# Patient Record
Sex: Female | Born: 1960 | Race: White | Hispanic: No | Marital: Single
Health system: Southern US, Community
[De-identification: ages and names within clinical notes are randomized; demographics above are authoritative.]

---

## 2013-06-23 ENCOUNTER — Other Ambulatory Visit: Payer: Self-pay | Admitting: Internal Medicine

## 2013-06-23 DIAGNOSIS — E041 Nontoxic single thyroid nodule: Secondary | ICD-10-CM

## 2013-06-25 ENCOUNTER — Ambulatory Visit
Admission: RE | Admit: 2013-06-25 | Discharge: 2013-06-25 | Disposition: A | Discharge status: Home / Self Care | Payer: BC Managed Care – PPO | Source: Ambulatory Visit | Attending: Internal Medicine | Admitting: Internal Medicine

## 2013-06-25 DIAGNOSIS — E041 Nontoxic single thyroid nodule: Secondary | ICD-10-CM

## 2014-05-10 ENCOUNTER — Other Ambulatory Visit: Payer: Self-pay | Admitting: Obstetrics and Gynecology

## 2014-05-10 DIAGNOSIS — E041 Nontoxic single thyroid nodule: Secondary | ICD-10-CM

## 2014-05-18 ENCOUNTER — Other Ambulatory Visit: Payer: BC Managed Care – PPO

## 2014-07-06 ENCOUNTER — Ambulatory Visit
Admission: RE | Admit: 2014-07-06 | Discharge: 2014-07-06 | Disposition: A | Discharge status: Home / Self Care | Payer: BLUE CROSS/BLUE SHIELD | Source: Ambulatory Visit | Attending: Obstetrics and Gynecology | Admitting: Obstetrics and Gynecology

## 2014-07-06 DIAGNOSIS — E041 Nontoxic single thyroid nodule: Secondary | ICD-10-CM

## 2014-08-22 ENCOUNTER — Other Ambulatory Visit: Payer: Self-pay | Admitting: Endocrinology

## 2014-08-22 DIAGNOSIS — E049 Nontoxic goiter, unspecified: Secondary | ICD-10-CM

## 2014-12-28 ENCOUNTER — Other Ambulatory Visit: Payer: Self-pay | Admitting: Orthopedic Surgery

## 2014-12-28 DIAGNOSIS — S52121A Displaced fracture of head of right radius, initial encounter for closed fracture: Secondary | ICD-10-CM

## 2014-12-29 ENCOUNTER — Ambulatory Visit
Admission: RE | Admit: 2014-12-29 | Discharge: 2014-12-29 | Disposition: A | Discharge status: Home / Self Care | Payer: BLUE CROSS/BLUE SHIELD | Source: Ambulatory Visit | Attending: Orthopedic Surgery | Admitting: Orthopedic Surgery

## 2014-12-29 DIAGNOSIS — S52121A Displaced fracture of head of right radius, initial encounter for closed fracture: Secondary | ICD-10-CM

## 2015-01-30 ENCOUNTER — Other Ambulatory Visit: Payer: Self-pay | Admitting: Internal Medicine

## 2015-01-30 DIAGNOSIS — E049 Nontoxic goiter, unspecified: Secondary | ICD-10-CM

## 2015-02-01 ENCOUNTER — Ambulatory Visit
Admission: RE | Admit: 2015-02-01 | Discharge: 2015-02-01 | Disposition: A | Discharge status: Home / Self Care | Payer: BLUE CROSS/BLUE SHIELD | Source: Ambulatory Visit | Attending: Internal Medicine | Admitting: Internal Medicine

## 2015-02-01 DIAGNOSIS — E049 Nontoxic goiter, unspecified: Secondary | ICD-10-CM

## 2015-02-23 ENCOUNTER — Other Ambulatory Visit: Payer: BLUE CROSS/BLUE SHIELD

## 2016-02-20 ENCOUNTER — Other Ambulatory Visit: Payer: Self-pay | Admitting: Internal Medicine

## 2016-02-20 DIAGNOSIS — E042 Nontoxic multinodular goiter: Secondary | ICD-10-CM

## 2016-02-26 ENCOUNTER — Ambulatory Visit
Admission: RE | Admit: 2016-02-26 | Discharge: 2016-02-26 | Disposition: A | Discharge status: Home / Self Care | Payer: Managed Care, Other (non HMO) | Source: Ambulatory Visit | Attending: Internal Medicine | Admitting: Internal Medicine

## 2016-02-26 DIAGNOSIS — E042 Nontoxic multinodular goiter: Secondary | ICD-10-CM

## 2016-06-30 IMAGING — CT CT ELBOW*R* W/O CM
4 of 5 series · 10 of 20 positions shown, 11 images · non-contrast
Comparison: None.

CLINICAL DATA: Patient tripped and fell 2 days ago. Evaluate radial
head fracture. Initial encounter.

EXAM:
CT OF THE RIGHT ELBOW WITHOUT CONTRAST; 3-DIMENSIONAL CT IMAGE
RENDERING ON INDEPENDENT WORKSTATION
TECHNIQUE: Multidetector CT imaging of the right elbow was performed according
to the standard protocol. Multiplanar CT image and three-dimensional
reconstructions were also generated.

[Series 5: upper ext soft · axial · 0.31mm/px · z∈[-5,+35]mm · 2 of 49 slices shown]
[im 17/49  soft-tissue]
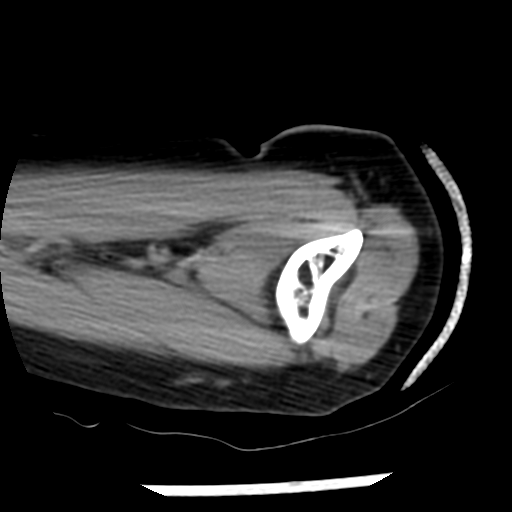
[im 33/49  soft-tissue]
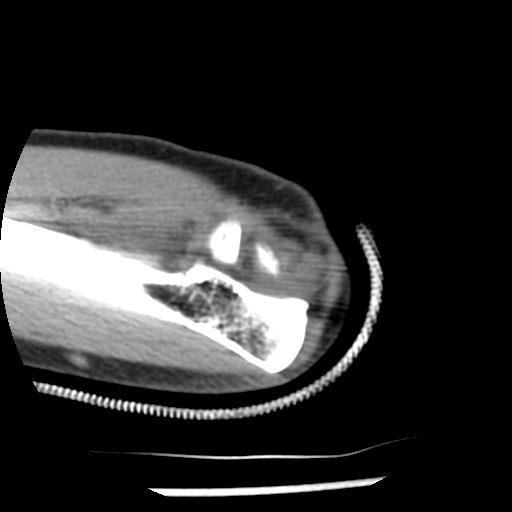

[Series 201: cor bone · axial · 0.31mm/px · z∈[-45,+93]mm · 3 of 48 slices shown, 4 images]
[im 1/48  soft-tissue]
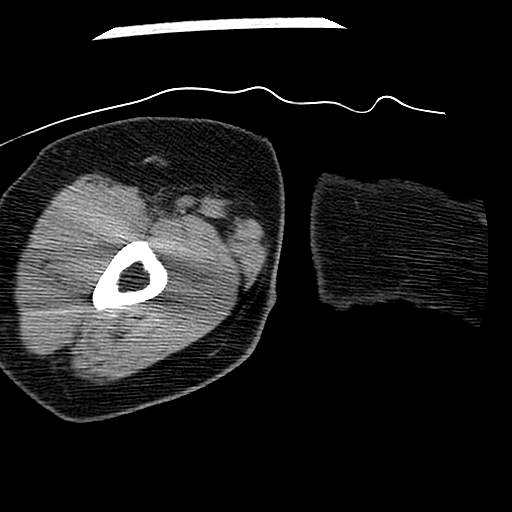
[im 1/48  bone]
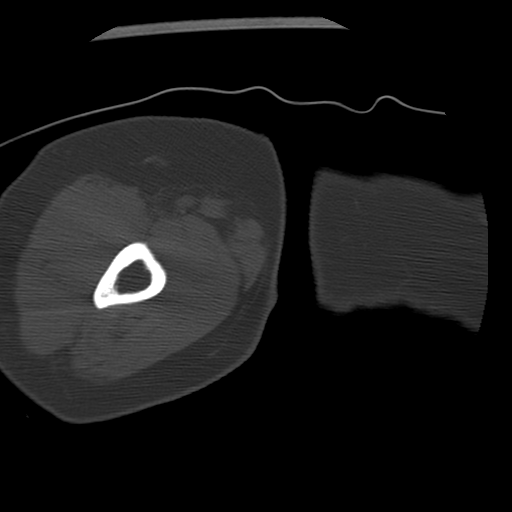
[im 24/48  bone]
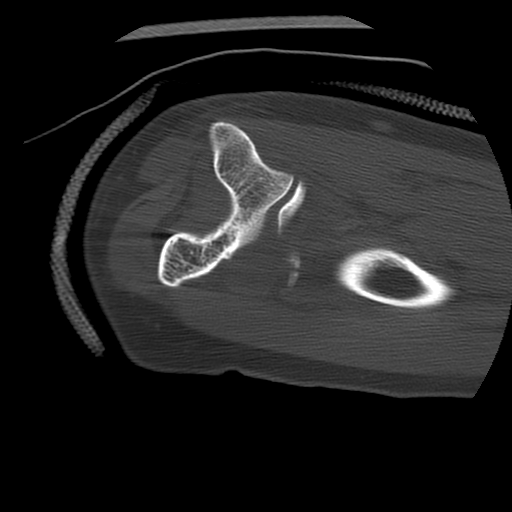
[im 48/48  bone]
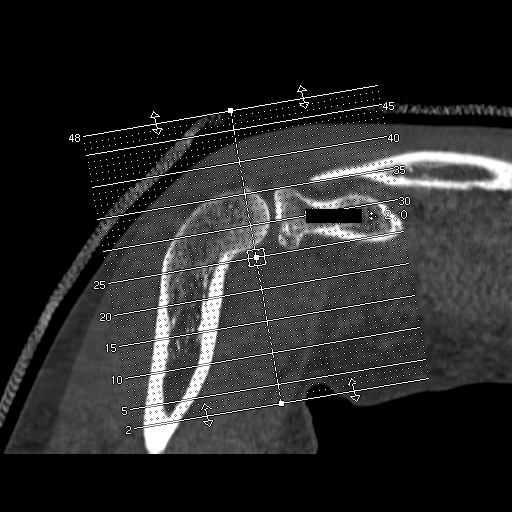

[Series 300: sag soft · coronal · 0.31mm/px · 2 of 55 slices shown]
[im 19/55  soft-tissue]
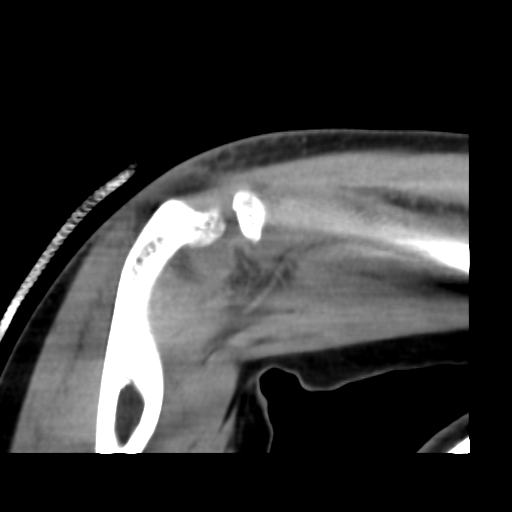
[im 37/55  soft-tissue]
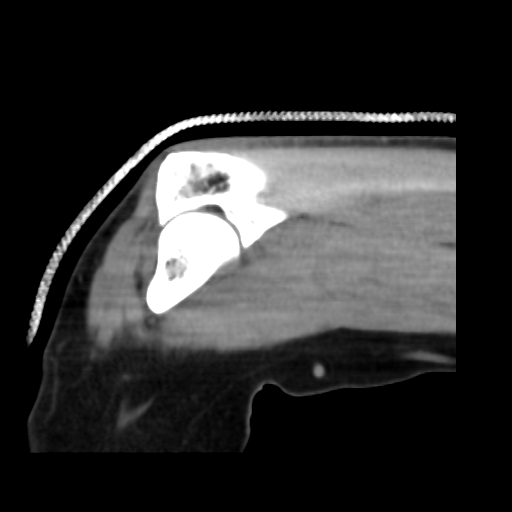

[Series 302: axial soft · sagittal · 0.31mm/px · 3 of 47 slices shown]
[im 10/47  bone]
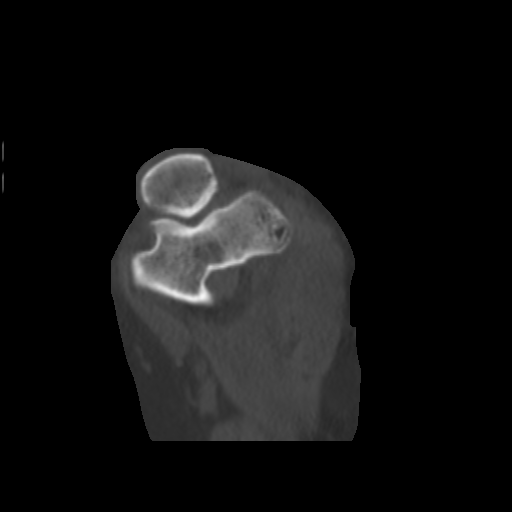
[im 19/47  bone]
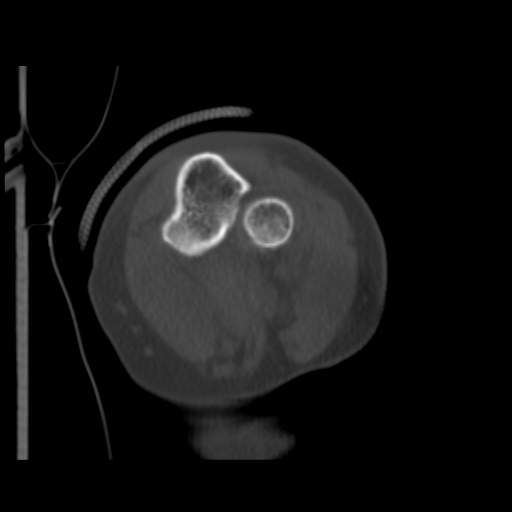
[im 28/47  bone]
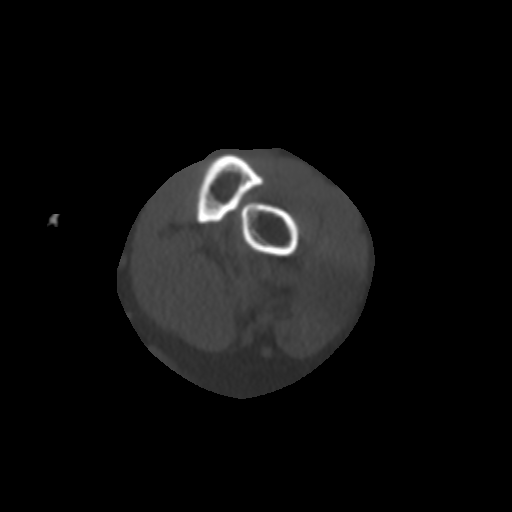

[10 of 20 positions shown; findings below may reference images not displayed]

FINDINGS: The elbow is splinted at approximately 90 degrees. There is a
moderate-sized hemarthrosis. Intra-articular fracture involving the
anterior half of the radial head is associated with mild comminution
and depression of the articular surface. The articular surface is
depressed by up to 2 mm. The radial head is located. No displaced
intra-articular fracture fragments demonstrated.

The proximal ulna and distal humerus are intact. No evidence of
capitellar osteochondral injury.

The biceps, brachialis and triceps tendons appear intact.
IMPRESSION: 1. Mildly depressed and comminuted intra-articular fracture of the
radial head as described.
2. No subluxation or capitellar injury identified.
3. Moderate size hemarthrosis.

## 2016-08-03 IMAGING — US US SOFT TISSUE HEAD/NECK
1 series · 13 of 25 positions shown · non-contrast
Comparison: Most recent prior thyroid ultrasound 07/06/2014

CLINICAL DATA: 54-year-old female with bilateral thyroid nodules

EXAM:
THYROID ULTRASOUND
TECHNIQUE: Ultrasound examination of the thyroid gland and adjacent soft
tissues was performed.

[Series 1: us soft tissue head/neck · 0.05mm/px · 13 of 57 slices shown]
[im 1/57]
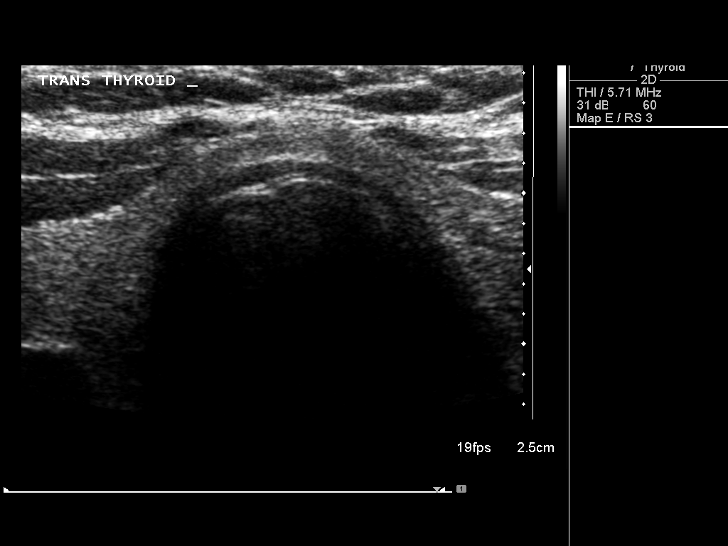
[im 5/57]
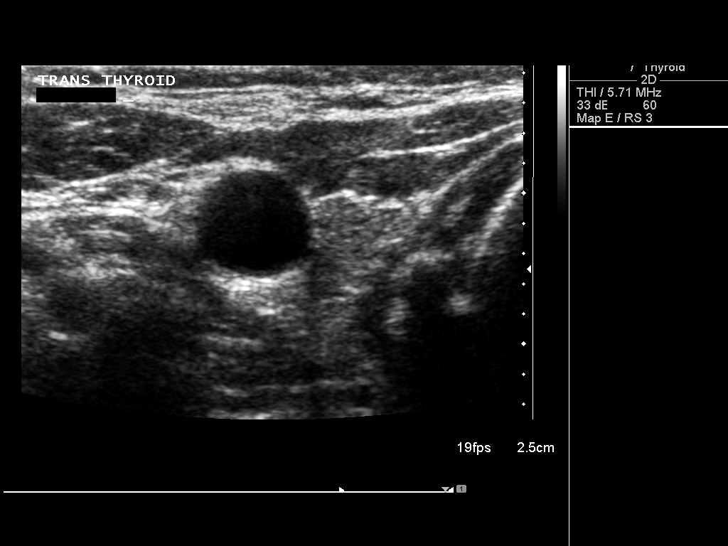
[im 10/57]
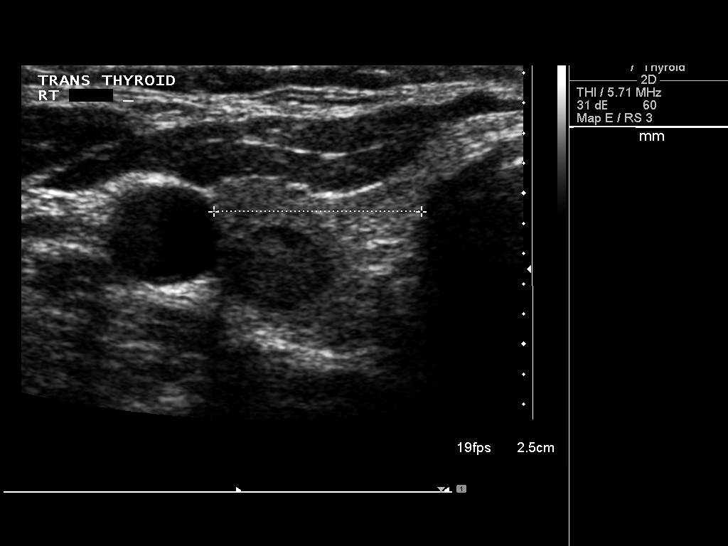
[im 15/57]
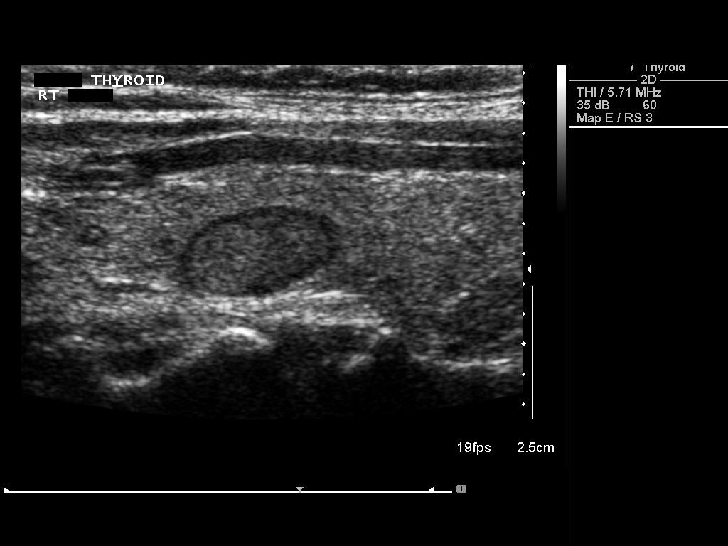
[im 19/57]
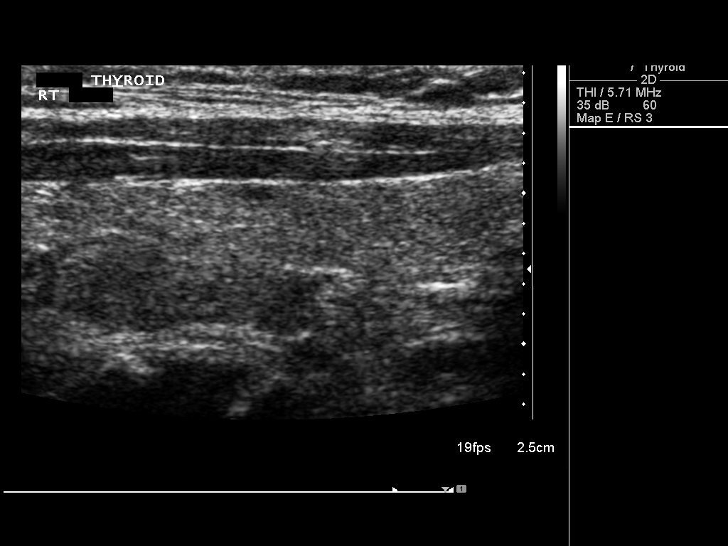
[im 24/57]
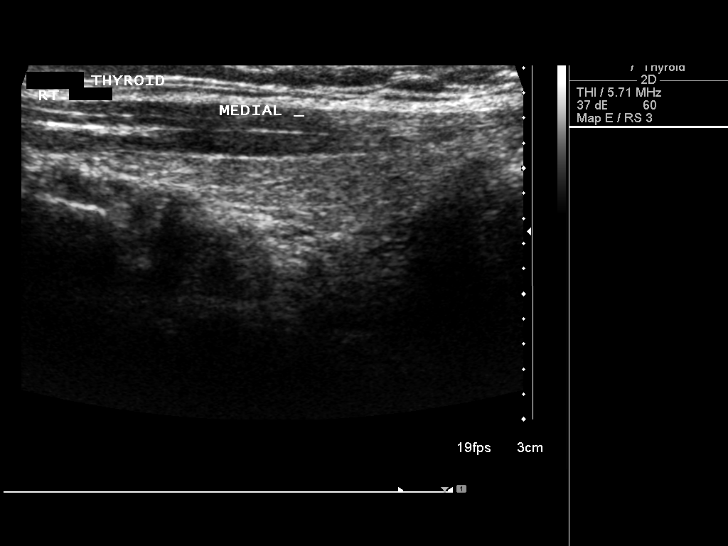
[im 29/57]
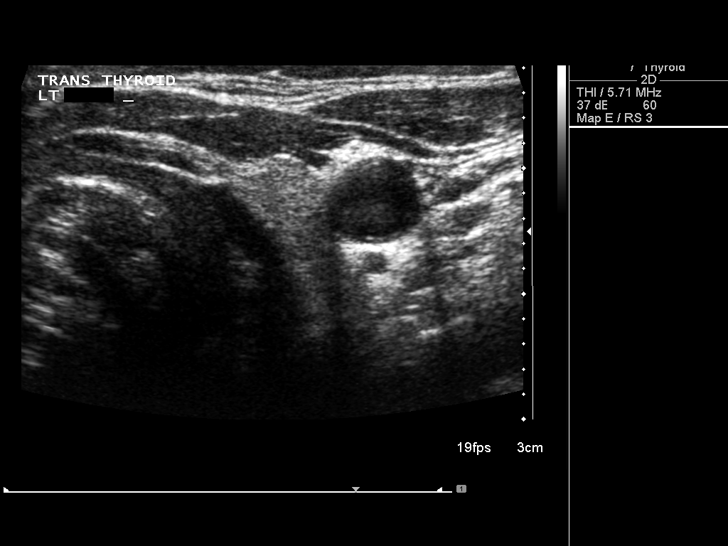
[im 33/57]
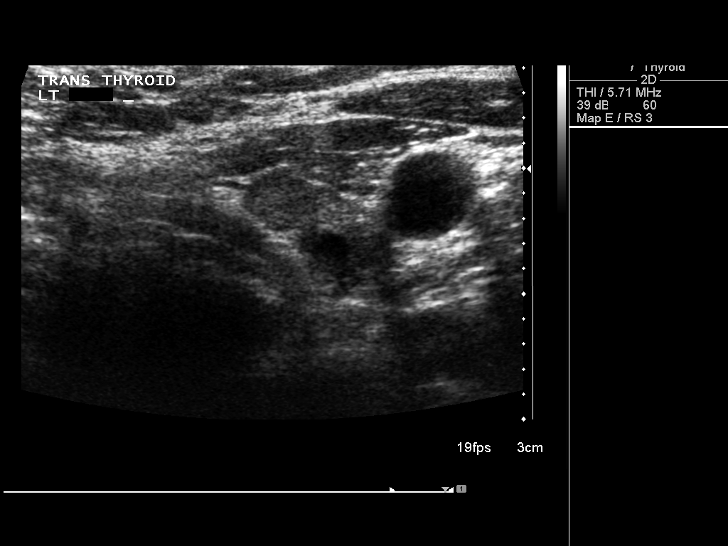
[im 38/57]
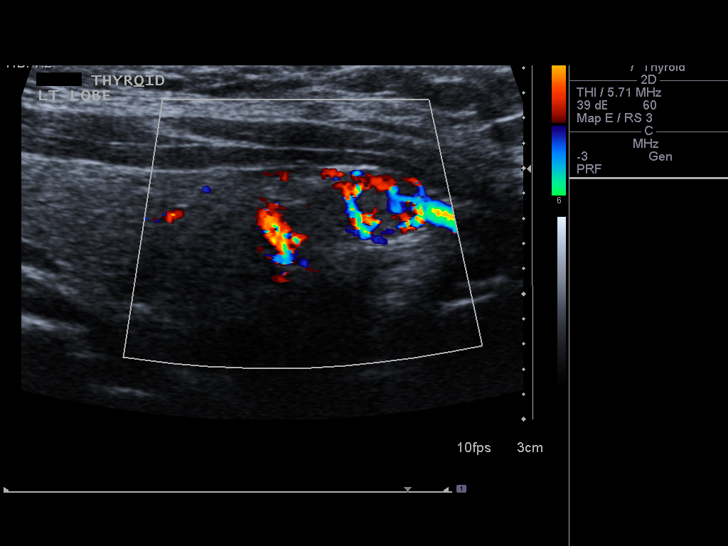
[im 43/57]
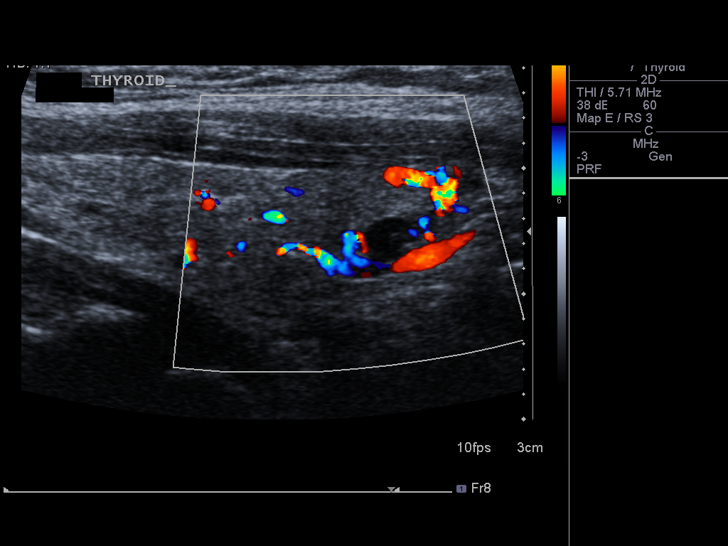
[im 47/57]
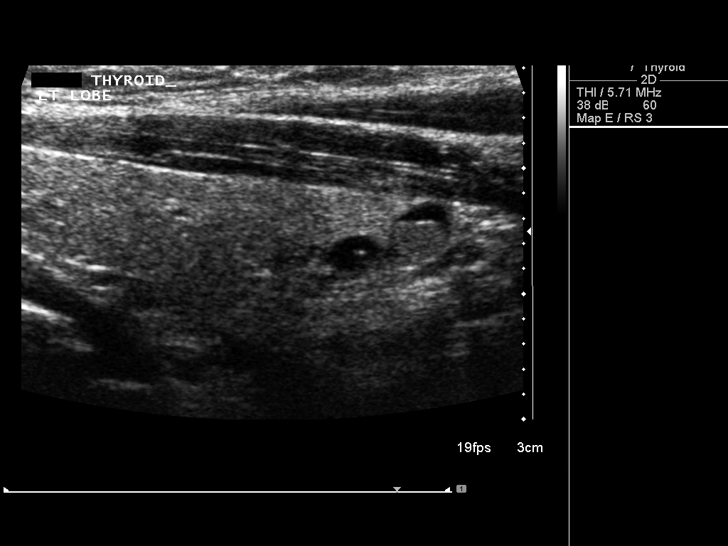
[im 52/57]
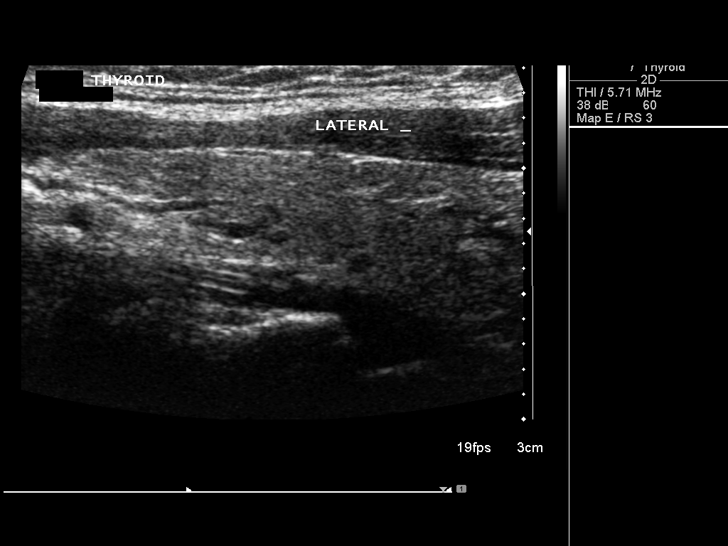
[im 57/57]
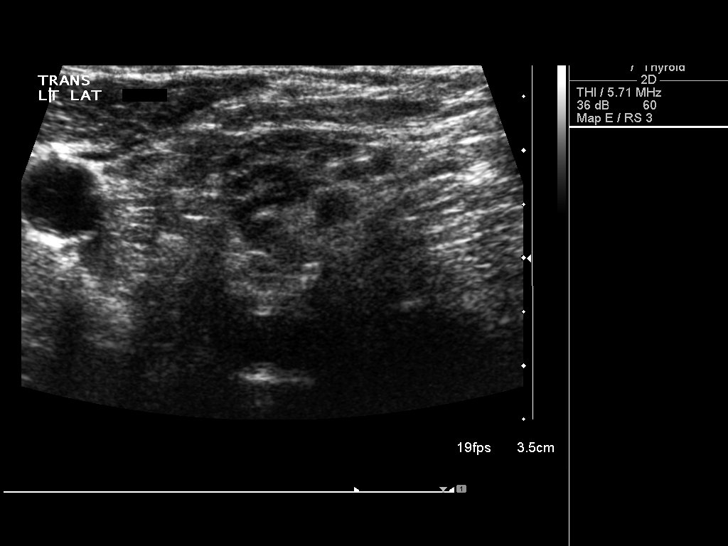

[13 of 25 positions shown; findings below may reference images not displayed]

FINDINGS: Right thyroid lobe

Measurements: 5.0 x 1.2 x 1.4 cm.

1. Hypoechoic solid nodule in the lateral aspect of the mid gland
measures 11 x 6 x 7 mm which is insignificantly changed compared to
10 x 6 x 7 mm previously.
Left thyroid lobe

Measurements: 5.0 x 1.4 x 1.3 cm.

1. Hypoechoic solid nodule in the medial aspect of the mid to lower
gland measures 6 mm (previously 5 mm)
2. Mixed cystic and solid nodule in the lower gland measures 6 mm
(previously 10 mm)
3. Mixed cystic and solid nodule in the lower gland measures 5 mm
(not previously measured. This may have been previously measured in
continuity with the nodule #2)
Isthmus

Thickness: 0.3 cm.  No nodules visualized.

Lymphadenopathy

None visualized.
IMPRESSION: Stable small bilateral thyroid nodules.

Findings do not meet current SRU consensus criteria for biopsy.
Follow-up by clinical exam is recommended. If patient has known risk
factors for thyroid carcinoma, consider follow-up ultrasound in 12
months. If patient is clinically hyperthyroid, consider nuclear
medicine thyroid uptake and scan.Reference: Management of Thyroid
Nodules Detected at US: [employer] in Ultrasound

## 2017-08-28 IMAGING — US US SOFT TISSUE HEAD/NECK
1 series · 12 of 25 positions shown · non-contrast
Comparison: 07/06/2014; 06/25/2013 an

CLINICAL DATA: Goiter.

EXAM:
THYROID ULTRASOUND
TECHNIQUE: Ultrasound examination of the thyroid gland and adjacent soft
tissues was performed.

[Series 1: us soft tissue head/neck · 0.04mm/px · 12 of 43 slices shown]
[im 2/43]
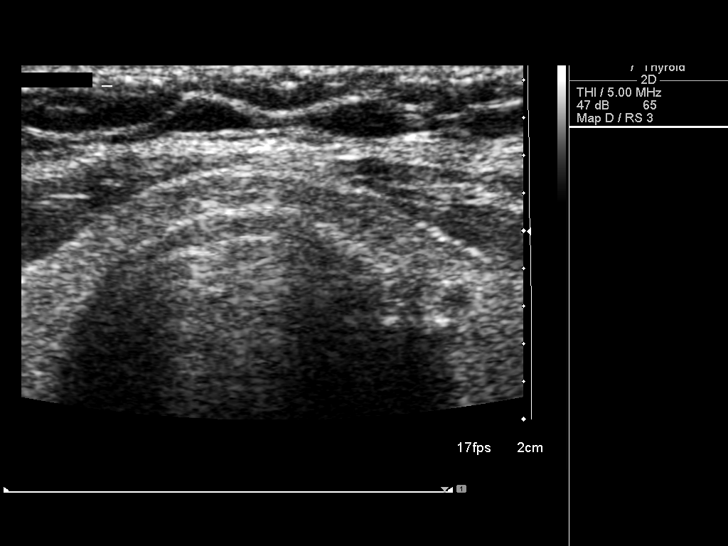
[im 6/43]
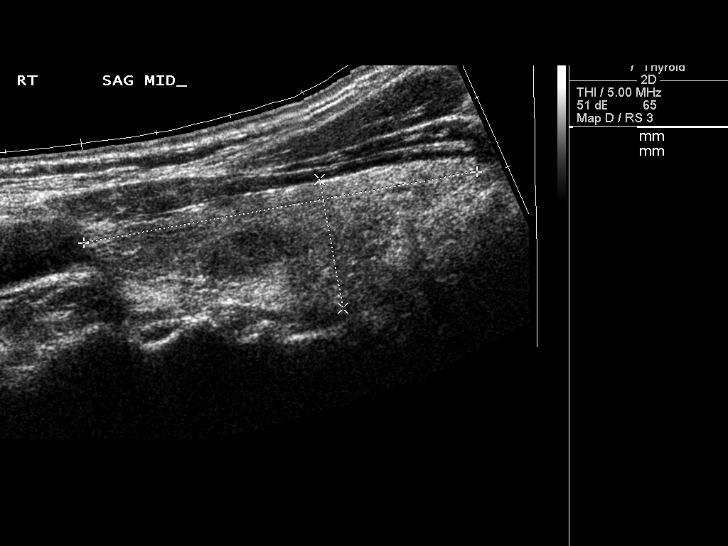
[im 9/43]
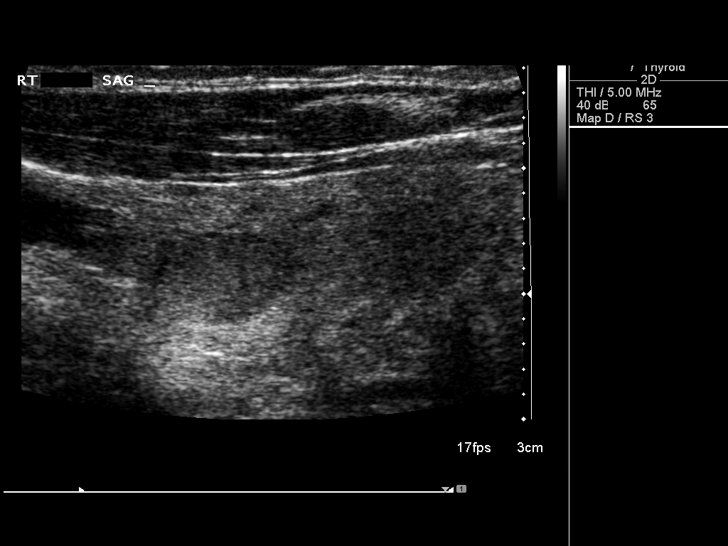
[im 13/43]
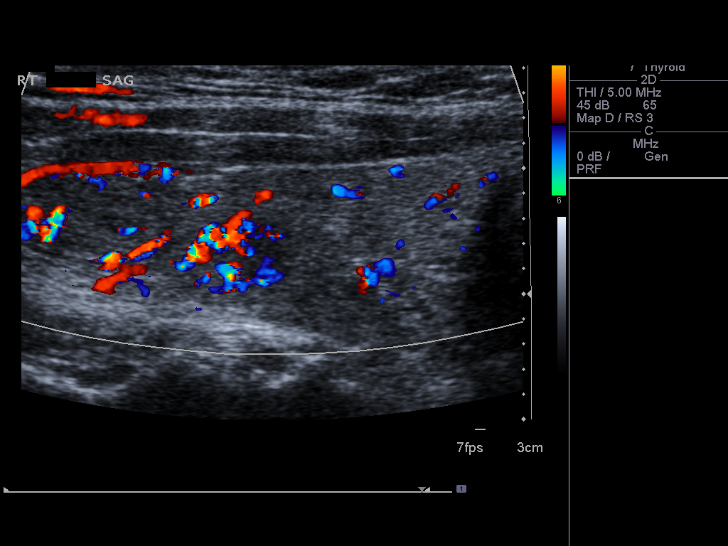
[im 16/43]
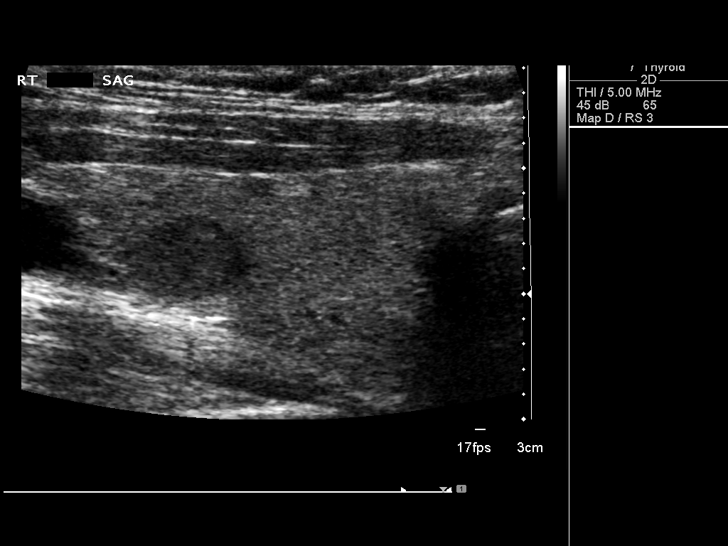
[im 20/43]
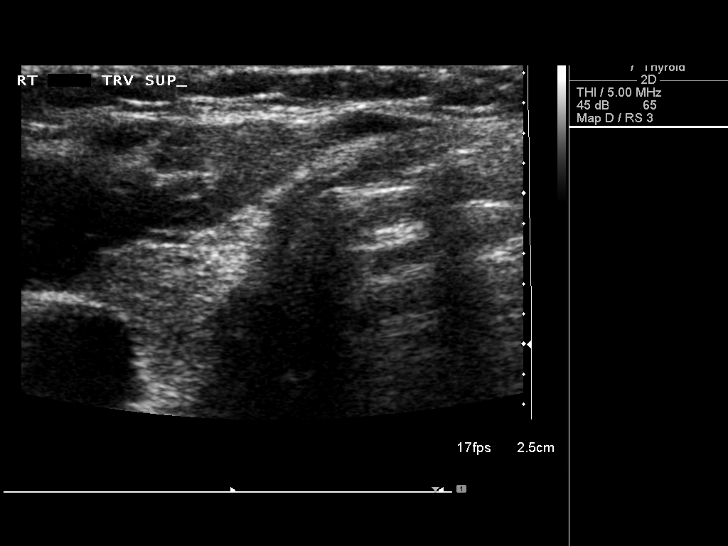
[im 23/43]
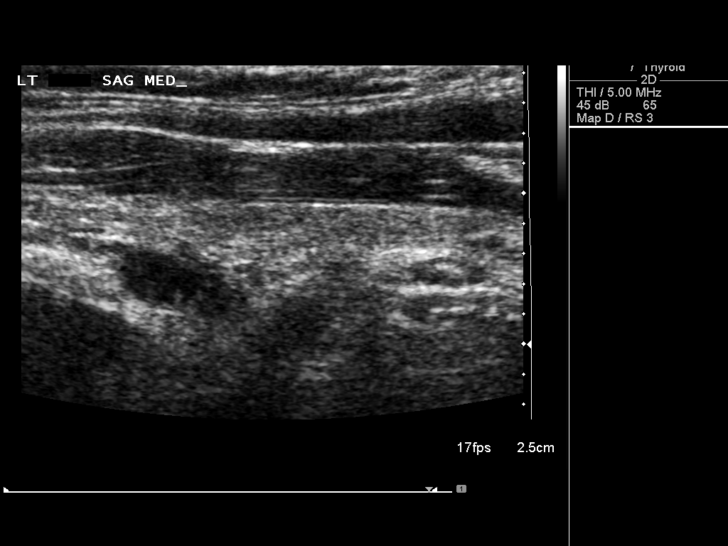
[im 27/43]
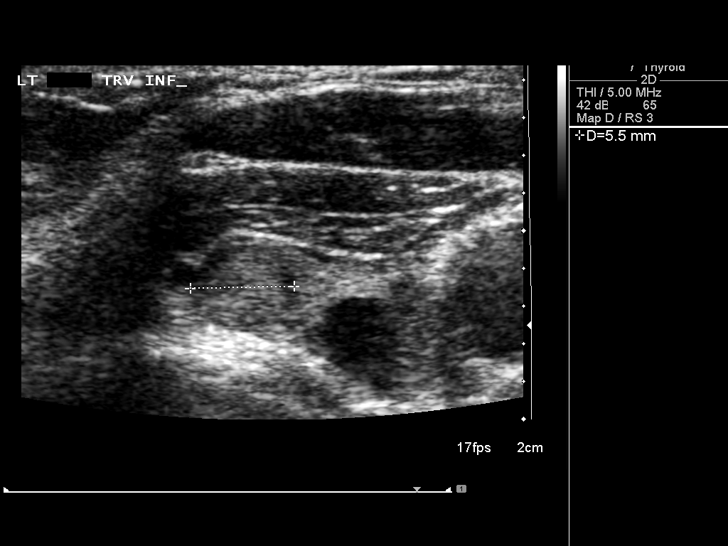
[im 30/43]
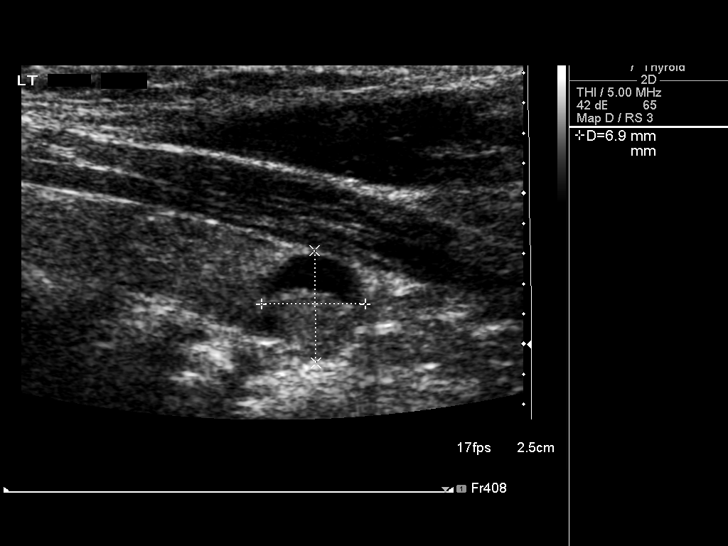
[im 34/43]
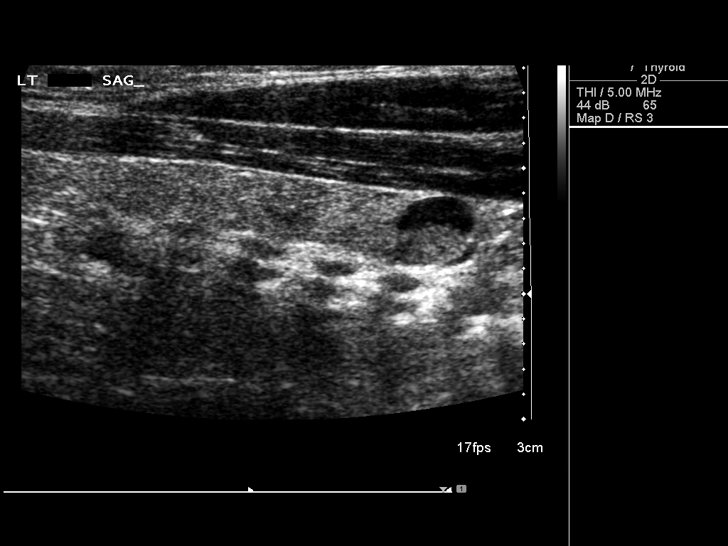
[im 37/43]
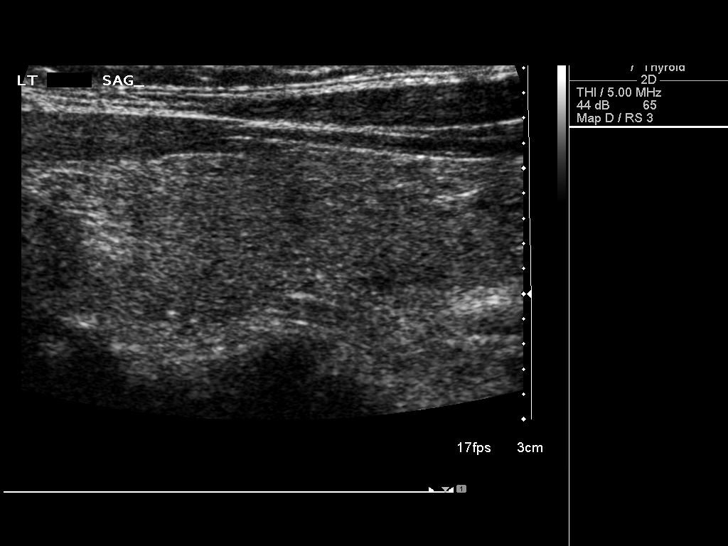
[im 41/43]
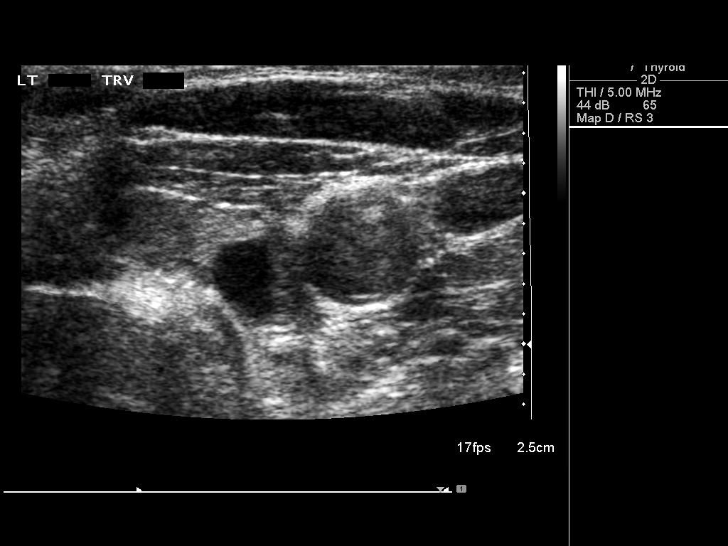

[12 of 25 positions shown; findings below may reference images not displayed]

FINDINGS: Parenchymal Echotexture: Mildly heterogenous

Estimated total number of nodules >/= 1 cm: 1

Number of spongiform nodules > 2 cm not described below (TR1): 0

Number of mixed cystic and solid nodules > 1.5 cm not described
below (TR2): 0

_________________________________________________________

Isthmus: Normal in size measures 0.3 cm in diameter, unchanged

No discrete nodules are identified within the thyroid isthmus.

_________________________________________________________

Right lobe: Normal in size measuring 5.3 x 1.7 x 1.5 cm, unchanged,
previously, 5.0 x 1.2 x 1.4 cm

Nodule # 1:

Prior biopsy: No

Location: Right; Mid

Size: 1.1 x 0.8 x 0.6 cm, unchanged, previously, 1.0 x 0.8 x 0.6 cm
when compared to the [DATE] examination

Composition: solid/almost completely solid (2)

Echogenicity: isoechoic (1)

Shape: not taller-than-wide (0)

Margins: ill-defined (0)

Echogenic foci: none (0)

ACR TI-RADS total points: 3.

ACR TI-RADS risk category: TR3 (3 points).

Change in features: No

Change in ACR TI-RADS risk category: No

ACR TI-RADS recommendations:

Given size (<1.5 cm) and appearance, this nodule does NOT meet
TI-RADS criteria for biopsy or dedicated follow-up.

_________________________________________________________

Left lobe: Normal in size measuring 4.9 x 1.7 x 1.4 cm, unchanged,
previously, 5.0 x 1.4 x 1.3 cm

Nodule # 1:

Prior biopsy: No

Location: Left; Inferior

Size: 0.7 x 0.6 x 0.6 cm, unchanged since the [DATE] examination,
previously, 0.6 x 0.7 x 0.5 cm

Composition: solid/almost completely solid (2)

Echogenicity: isoechoic (1)

Shape: not taller-than-wide (0)

Margins: smooth (0)

Echogenic foci: none (0)

ACR TI-RADS total points: 3.

ACR TI-RADS risk category: TR3 (3 points).

Change in features: No

Change in ACR TI-RADS risk category: No

ACR TI-RADS recommendations:

Given size (<1.5 cm) and appearance, this nodule does NOT meet
TI-RADS criteria for biopsy or dedicated follow-up.

Note is made of a 0.6 cm anechoic cystic nodule within the inferior
aspect the left lobe of the thyroid which contains a N echogenic
foci with ring down artifact suggestive of colloid. This nodule does
not meet imaging criteria to recommend percutaneous sampling or
dedicated follow-up.

Note is made of a punctate partially solid though prominently cystic
approximately 0.8 cm nodule within the left lobe of the thyroid
which appears morphologically similar to the [DATE] examination.
This nodule does not meet imaging criteria to recommend percutaneous
sampling or dedicated follow-up.
IMPRESSION: 1. Findings suggestive of multi nodular goiter. No definitive new or
enlarging thyroid nodules.
2. All discretely measured thyroid nodules are unchanged since at
least the [DATE] examination. Specifically, the dominant
approximately 1.1 cm nodule within the right lobe of the thyroid is
unchanged since most remote examination performed [DATE]. None of
the discretely measured thyroid nodules meet imaging criteria to
recommend percutaneous sampling or dedicated follow-up.
The above is in keeping with the ACR TI-RADS recommendations - [HOSPITAL] 3119;[DATE].
